# Patient Record
Sex: Male | Born: 1975 | Race: White | Hispanic: No | Marital: Married | Smoking: Former smoker
Health system: Southern US, Community
[De-identification: ages and names within clinical notes are randomized; demographics above are authoritative.]

## PROBLEM LIST (undated history)

## (undated) DIAGNOSIS — F32A Depression, unspecified: Secondary | ICD-10-CM

## (undated) HISTORY — DX: Depression, unspecified: F32.A

---
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
## 2004-01-13 ENCOUNTER — Other Ambulatory Visit (HOSPITAL_COMMUNITY): Admission: RE | Admit: 2004-01-13 | Discharge: 2004-01-19 | Payer: Self-pay | Admitting: *Deleted

## 2004-09-22 ENCOUNTER — Ambulatory Visit: Payer: Self-pay | Admitting: Family Medicine

## 2004-11-30 ENCOUNTER — Ambulatory Visit: Payer: Self-pay | Admitting: Family Medicine

## 2017-10-12 ENCOUNTER — Emergency Department (HOSPITAL_COMMUNITY)
Admission: EM | Admit: 2017-10-12 | Discharge: 2017-10-12 | Disposition: A | Discharge status: Home / Self Care | Payer: BLUE CROSS/BLUE SHIELD | Attending: Emergency Medicine | Admitting: Emergency Medicine

## 2017-10-12 ENCOUNTER — Emergency Department (HOSPITAL_COMMUNITY): Payer: BLUE CROSS/BLUE SHIELD

## 2017-10-12 ENCOUNTER — Encounter (HOSPITAL_COMMUNITY): Payer: Self-pay

## 2017-10-12 ENCOUNTER — Other Ambulatory Visit: Payer: Self-pay

## 2017-10-12 DIAGNOSIS — R079 Chest pain, unspecified: Secondary | ICD-10-CM | POA: Insufficient documentation

## 2017-10-12 DIAGNOSIS — R002 Palpitations: Secondary | ICD-10-CM | POA: Insufficient documentation

## 2017-10-12 LAB — CBC
HCT: 43 % (ref 39.0–52.0)
HEMOGLOBIN: 14.5 g/dL (ref 13.0–17.0)
MCH: 31.3 pg (ref 26.0–34.0)
MCHC: 33.7 g/dL (ref 30.0–36.0)
MCV: 92.7 fL (ref 78.0–100.0)
Platelets: 166 10*3/uL (ref 150–400)
RBC: 4.64 MIL/uL (ref 4.22–5.81)
RDW: 13 % (ref 11.5–15.5)
WBC: 4.5 10*3/uL (ref 4.0–10.5)

## 2017-10-12 LAB — BASIC METABOLIC PANEL
ANION GAP: 8 (ref 5–15)
BUN: 22 mg/dL — ABNORMAL HIGH (ref 6–20)
CALCIUM: 9.1 mg/dL (ref 8.9–10.3)
CHLORIDE: 106 mmol/L (ref 101–111)
CO2: 26 mmol/L (ref 22–32)
CREATININE: 0.78 mg/dL (ref 0.61–1.24)
GFR calc Af Amer: >60 mL/min (ref >60)
GFR calc non Af Amer: >60 mL/min (ref >60)
GLUCOSE: 77 mg/dL (ref 65–99)
Potassium: 3.2 mmol/L — ABNORMAL LOW (ref 3.5–5.1)
Sodium: 140 mmol/L (ref 135–145)

## 2017-10-12 LAB — I-STAT TROPONIN, ED: TROPONIN I, POC: 0 ng/mL (ref 0.00–0.08)

## 2017-10-12 NOTE — ED Provider Notes (Signed)
[facility] EMERGENCY DEPARTMENT Provider Note   CSN: [id number] Arrival date & time: 10/12/17  1320     History   Chief Complaint Chief Complaint  Patient presents with  . Chest Pain    HPI Ryan Wiggins is a 42 y.o. male.  HPI   42 year old male presents today with complaints of chest pain.  Patient notes that he was having some sinus related pressure and pain on the left side, this is slightly chronic in nature.  He has been on antibiotics several times for similar presentations.  He notes that his children were sick and went to urgent care today.  He notes that the nurse practitioner was asking thorough evaluation including chest pain.  He noted that he has had off-and-on left-sided chest discomfort and palpitations.  He notes they did an EKG and were concerned of the findings.  He was referred to the emergency room for further evaluation.  Patient reports that this is a very minor discomfort and soreness in the left lateral chest, he notes intermittently has palpitations, none presently, non-sustaining.  He denies any associated features.  He denies any cardiac history, he is a former smoker quitting approximately 15 years ago, no history of diabetes, hypertension or hyperlipidemia.  He does note family history of his grandfather having a heart attack in the late 93s otherwise no other family history.  He denies any history of DVT or PEs.     History reviewed. No pertinent past medical history.  There are no active problems to display for this patient.   History reviewed. No pertinent surgical history.     Home Medications    Prior to Admission medications   Not on File    Family History No family history on file.  Social History Social History   Tobacco Use  . Smoking status: Former Smoker  Substance Use Topics  . Alcohol use: Not on file  . Drug use: Not on file     Allergies   Patient has no known allergies.   Review of  Systems Review of Systems  All other systems reviewed and are negative.    Physical Exam Updated Vital Signs BP 118/88   Pulse (!) 55   Temp 98.1 F (36.7 C) (Oral)   Resp 19   SpO2 97%   Physical Exam  Constitutional: He is oriented to person, place, and time. He appears well-developed and well-nourished.  HENT:  Head: Normocephalic and atraumatic.  Eyes: Pupils are equal, round, and reactive to light. Conjunctivae are normal. Right eye exhibits no discharge. Left eye exhibits no discharge. No scleral icterus.  Neck: Normal range of motion. No JVD present. No tracheal deviation present.  Cardiovascular: Normal rate, regular rhythm, normal heart sounds and intact distal pulses. Exam reveals no gallop and no friction rub.  No murmur heard. Pulmonary/Chest: Effort normal and breath sounds normal. No stridor. No respiratory distress. He has no wheezes. He has no rales. He exhibits no tenderness.  Musculoskeletal: He exhibits no edema.  Neurological: He is alert and oriented to person, place, and time. Coordination normal.  Psychiatric: He has a normal mood and affect. His behavior is normal. Judgment and thought content normal.  Nursing note and vitals reviewed.    ED Treatments / Results  Labs (all labs ordered are listed, but only abnormal results are displayed) Labs Reviewed  BASIC METABOLIC PANEL - Abnormal; Notable for the following components:      Result Value   Potassium 3.2 (*)  BUN 22 (*)    All other components within normal limits  CBC  I-STAT TROPONIN, ED    EKG  EKG Interpretation None       ED EKG reviewed, no significant abnormalities, none previous to compare sinus bradycardia no ST depression or elevation  Radiology Dg Chest 2 View  Result Date: 10/12/2017 CLINICAL DATA:  Chest pain and sore throat.  Former smoker. EXAM: CHEST - 2 VIEW COMPARISON:  PA and lateral chest x-ray of May 13, 2005 FINDINGS: The lungs are well-expanded. There is an  azygos lobe anatomy on the right. The interstitial markings are mildly prominent. There is subtle nodularity overlying the lateral aspect of the left eighth rib. There was a smaller less dense nodular density on the 2006 study. The heart and pulmonary vascularity are normal. The mediastinum is normal in width. The trachea is midline. The bony thorax exhibits no acute abnormality. IMPRESSION: Mild chronic bronchitic-smoking related changes.  No pneumonia. Subtle nodularity peripherally in the left lower lung not clearly evident on the lateral view. Outpatient noncontrast chest CT scanning is recommended to exclude an abnormal nodule given the patient's smoking history. Electronically Signed   By: David  SwazilandJordan M.D.   On: 10/12/2017 14:14    Procedures Procedures (including critical care time)  Medications Ordered in ED Medications - No data to display   Initial Impression / Assessment and Plan / ED Course  I have reviewed the triage vital signs and the nursing notes.  Pertinent labs & imaging results that were available during my care of the patient were reviewed by me and considered in my medical decision making (see chart for details).     Final Clinical Impressions(s) / ED Diagnoses   Final diagnoses:  Nonspecific chest pain  Palpitations   Labs: I-STAT troponin, BMP, CBC  Imaging: DG chest 2 view, ED EKG  Consults:  Therapeutics:  Discharge Meds:   Assessment/Plan: 42 year old male presents today with chest pain.  Patient has very reassuring workup.  Normal troponin, reassuring EKG.  Patient's labs show hypokalemia.  I reviewed patient's EKG from urgent care, no significant findings requiring further evaluation or management here.  I do not believe patient is having any ACS symptoms he is low heart score, no signs of PE or DVT, no signs of dissection.  She does have incidental finding of nodule on chest x-ray, patient will follow-up as an outpatient for CT scan.  Patient is given  strict return precautions, verbalized understanding and agreement to today's plan had no further questions or concerns at the time of discharge.  Patient will follow-up with cardiology, ENT and primary care.       ED Discharge Orders    None       Rosalio LoudHedges, Cyan Moultrie, PA-C 10/12/17 1930    Phineas RealMabe, Latanya MaudlinMartha L, MD 10/12/17 [phone]

## 2017-10-12 NOTE — ED Triage Notes (Addendum)
Per pt: Pt went to urgent care and they said that he needed to come to the ED and have more tests completed.  Pt states that he just has "some soreness" and points to the area just right of his left arm pit. Pt denies any other symptoms. Pt states that he originally went to urgent care "because of a sinus thing and I have this knot in the back of my throat. The lady just kept asking me more and more questions and then they ended up doing an EKG".   EKG from urgent care is brought in with pt. Rate on EKG is 45. Someone wrote "junctional rhythm" on it.

## 2017-10-12 NOTE — ED Notes (Signed)
Jeff PA at bedside.  

## 2017-10-12 NOTE — Discharge Instructions (Addendum)
Please read attached information. If you experience any new or worsening signs or symptoms please return to the emergency room for evaluation. Please follow-up with your primary care provider or specialist as discussed.  °

## 2017-10-16 DIAGNOSIS — E041 Nontoxic single thyroid nodule: Secondary | ICD-10-CM | POA: Insufficient documentation

## 2017-10-16 DIAGNOSIS — Z8669 Personal history of other diseases of the nervous system and sense organs: Secondary | ICD-10-CM | POA: Insufficient documentation

## 2017-10-16 DIAGNOSIS — F319 Bipolar disorder, unspecified: Secondary | ICD-10-CM | POA: Insufficient documentation

## 2017-10-16 DIAGNOSIS — F410 Panic disorder [episodic paroxysmal anxiety] without agoraphobia: Secondary | ICD-10-CM | POA: Insufficient documentation

## 2017-10-16 DIAGNOSIS — Z8659 Personal history of other mental and behavioral disorders: Secondary | ICD-10-CM | POA: Insufficient documentation

## 2017-10-16 DIAGNOSIS — G2581 Restless legs syndrome: Secondary | ICD-10-CM | POA: Insufficient documentation

## 2017-10-16 DIAGNOSIS — J4530 Mild persistent asthma, uncomplicated: Secondary | ICD-10-CM | POA: Insufficient documentation

## 2017-10-16 DIAGNOSIS — R6882 Decreased libido: Secondary | ICD-10-CM | POA: Insufficient documentation

## 2017-10-16 DIAGNOSIS — F331 Major depressive disorder, recurrent, moderate: Secondary | ICD-10-CM | POA: Insufficient documentation

## 2018-08-26 IMAGING — DX DG CHEST 2V
2 series · 2 of 2 positions shown · non-contrast
Comparison: PA and lateral chest x-ray May 13, 2005

CLINICAL DATA: Chest pain and sore throat.  Former smoker.

EXAM:
CHEST - 2 VIEW

[chest pa]
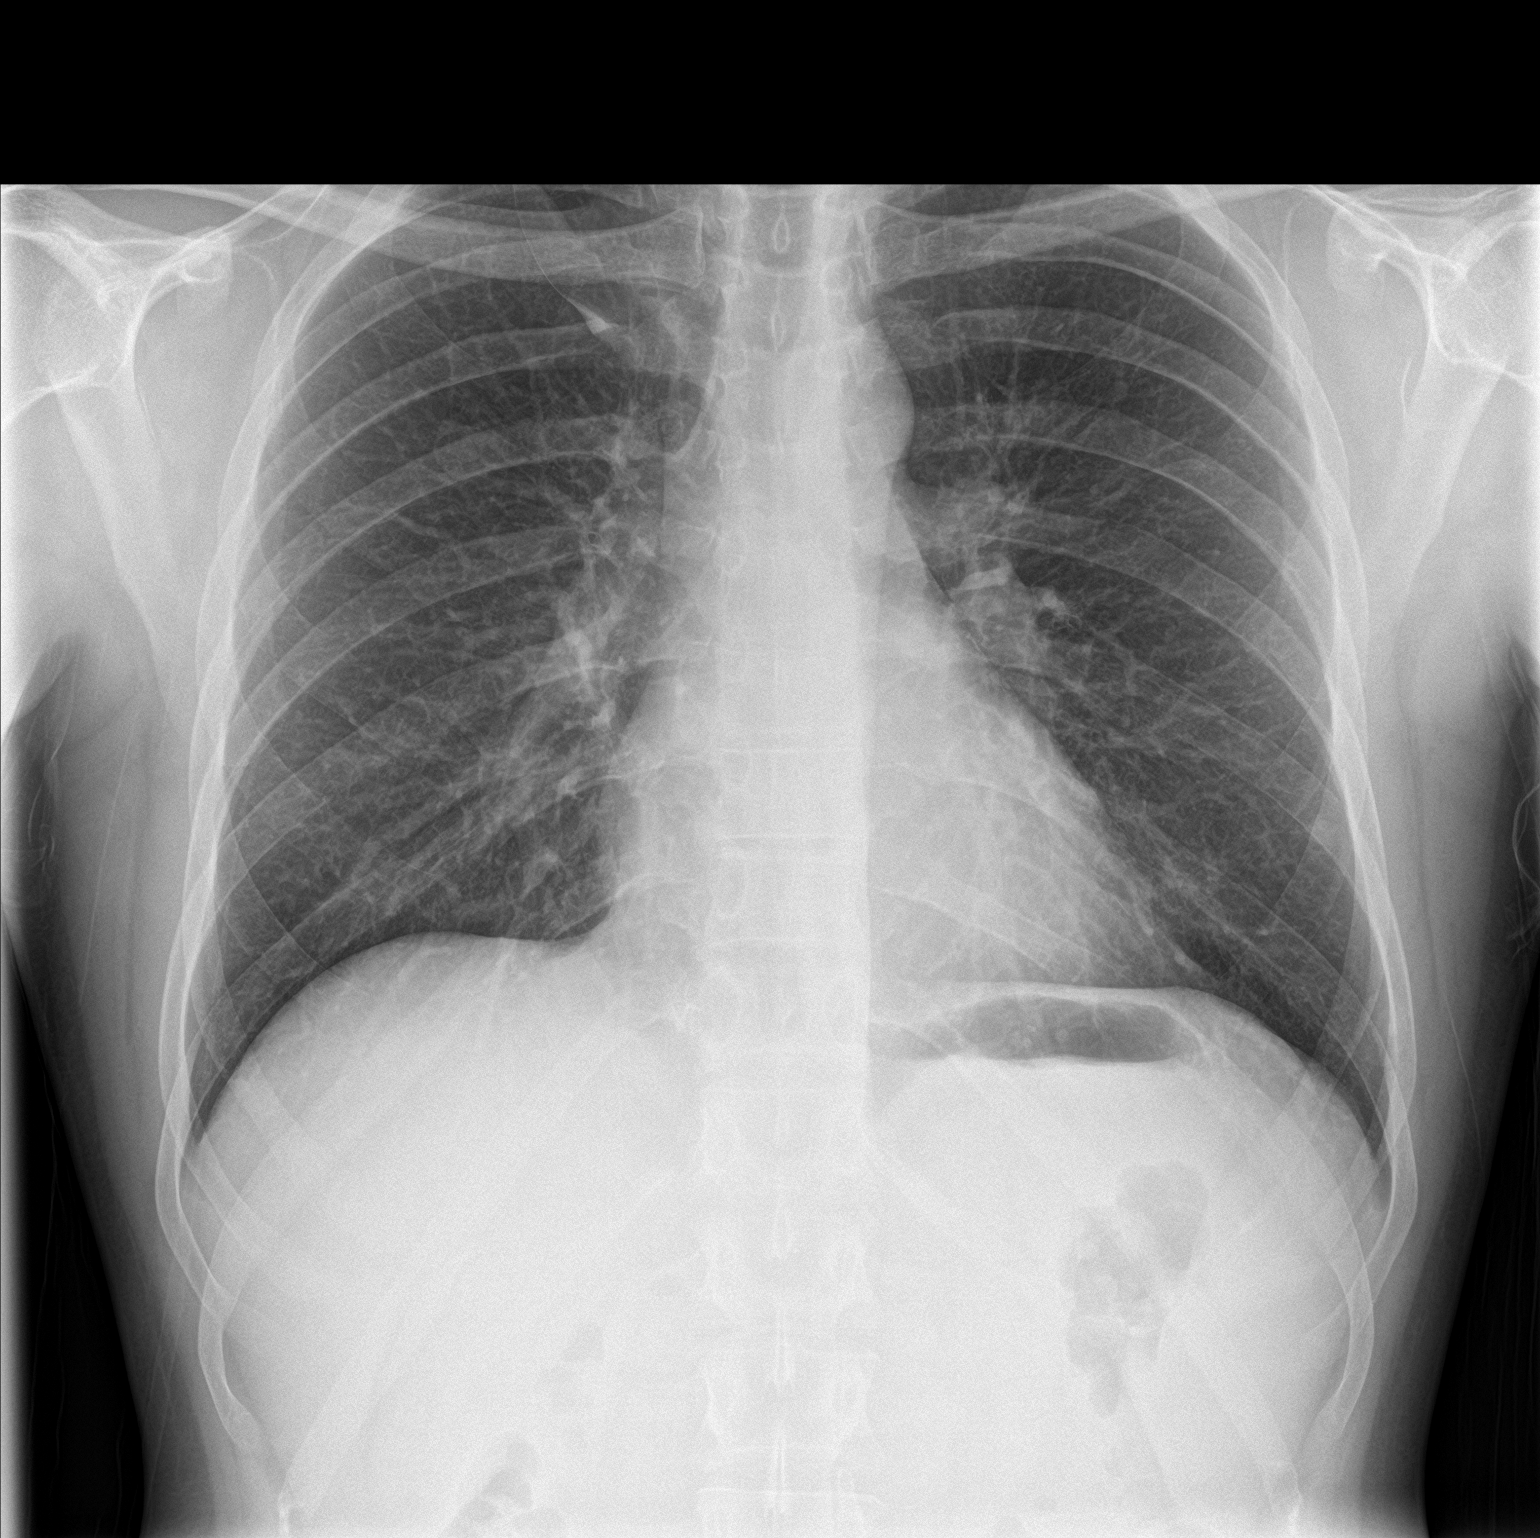

[chest lat]
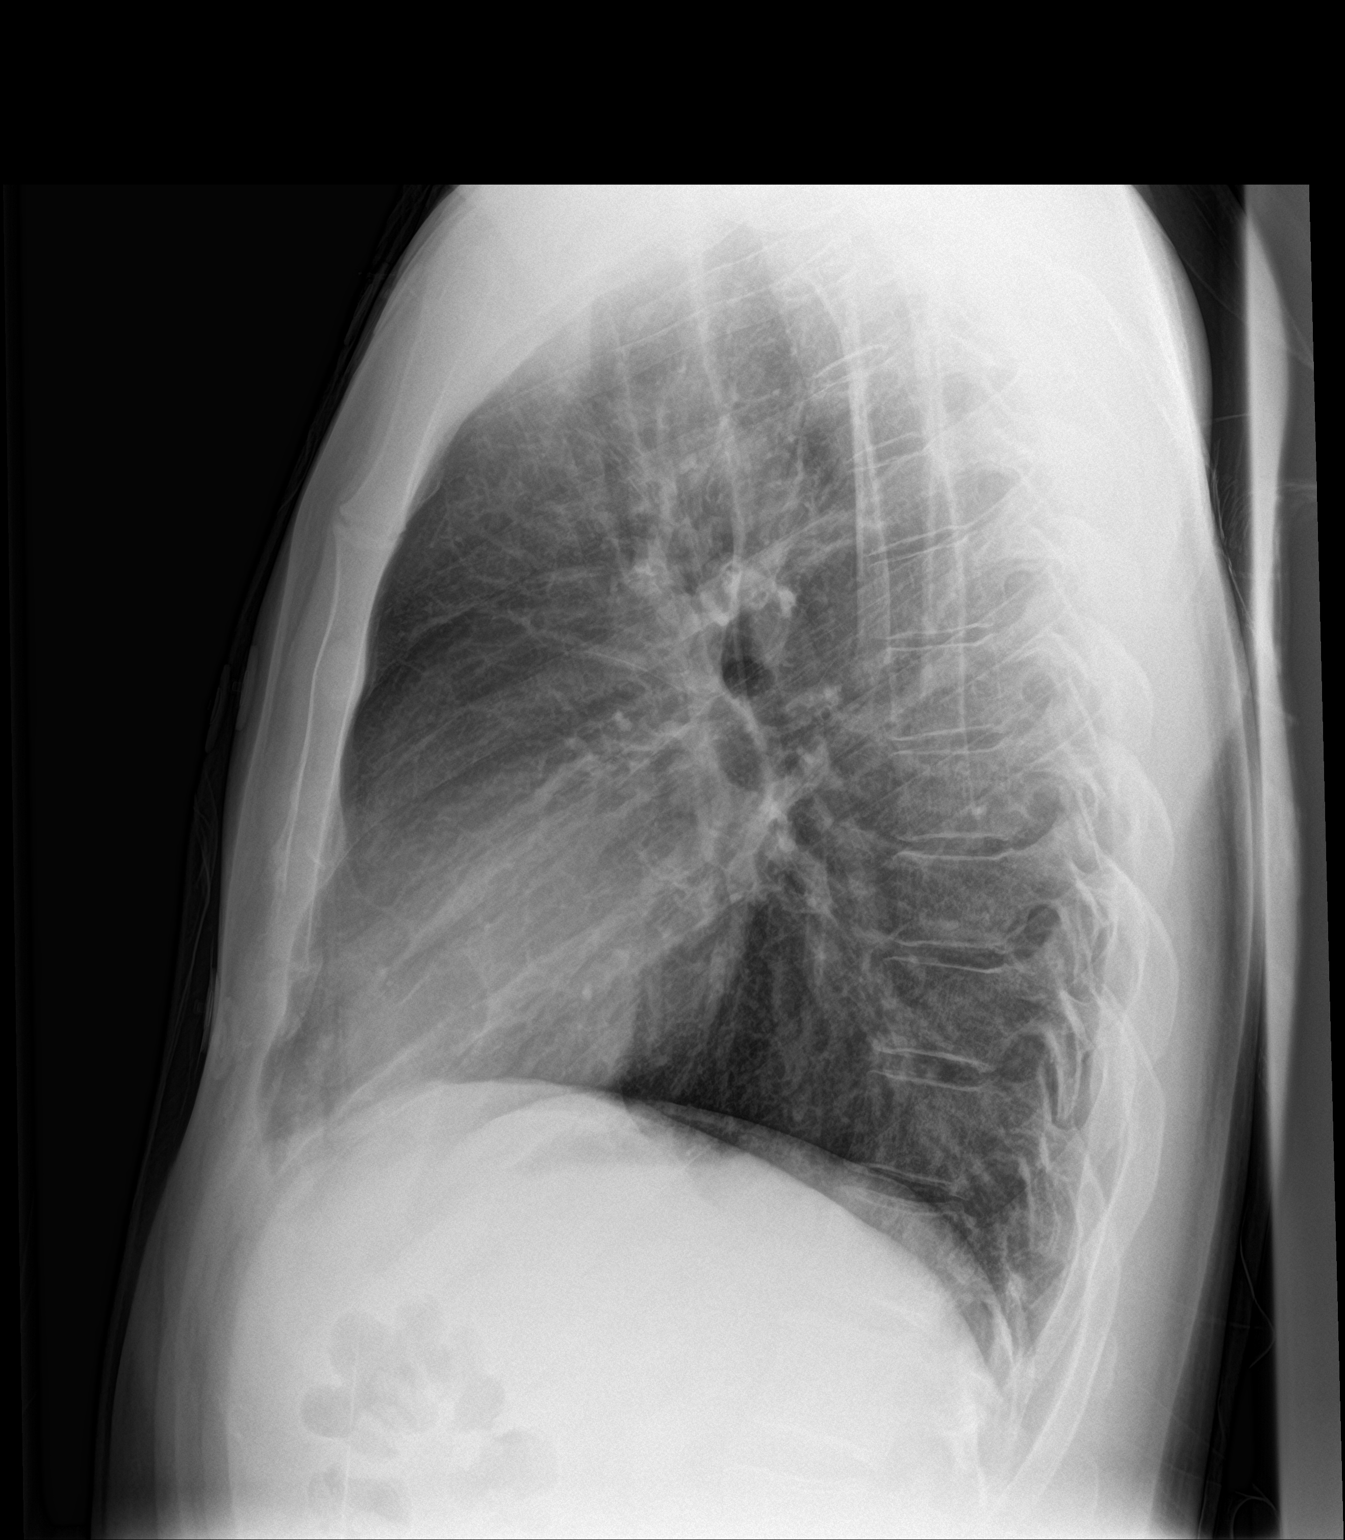

[2 of 2 positions shown; findings below may reference images not displayed]

FINDINGS: The lungs are well-expanded. There is an azygos lobe anatomy on the
right. The interstitial markings are mildly prominent. There is
subtle nodularity overlying the lateral aspect of the left eighth
rib. There was a smaller less dense nodular density on the 2334
study. The heart and pulmonary vascularity are normal. The
mediastinum is normal in width. The trachea is midline. The bony
thorax exhibits no acute abnormality.
IMPRESSION: Mild chronic bronchitic-smoking related changes.  No pneumonia.

Subtle nodularity peripherally in the left lower lung not clearly
evident on the lateral view. Outpatient noncontrast chest CT
scanning is recommended to exclude an abnormal nodule given the
patient's smoking history.

## 2023-08-29 DIAGNOSIS — K2100 Gastro-esophageal reflux disease with esophagitis, without bleeding: Secondary | ICD-10-CM | POA: Insufficient documentation

## 2024-01-24 ENCOUNTER — Encounter: Payer: Self-pay | Admitting: Family Medicine

## 2024-01-24 ENCOUNTER — Ambulatory Visit: Admitting: Family Medicine

## 2024-01-24 VITALS — BP 110/80 | HR 64 | Temp 97.6°F | Resp 16 | Ht 72.0 in | Wt 181.6 lb

## 2024-01-24 DIAGNOSIS — M75101 Unspecified rotator cuff tear or rupture of right shoulder, not specified as traumatic: Secondary | ICD-10-CM | POA: Diagnosis not present

## 2024-01-24 DIAGNOSIS — Z114 Encounter for screening for human immunodeficiency virus [HIV]: Secondary | ICD-10-CM | POA: Insufficient documentation

## 2024-01-24 DIAGNOSIS — Z1211 Encounter for screening for malignant neoplasm of colon: Secondary | ICD-10-CM | POA: Insufficient documentation

## 2024-01-24 DIAGNOSIS — R5382 Chronic fatigue, unspecified: Secondary | ICD-10-CM | POA: Insufficient documentation

## 2024-01-24 DIAGNOSIS — J4530 Mild persistent asthma, uncomplicated: Secondary | ICD-10-CM

## 2024-01-24 DIAGNOSIS — M25512 Pain in left shoulder: Secondary | ICD-10-CM | POA: Insufficient documentation

## 2024-01-24 DIAGNOSIS — E782 Mixed hyperlipidemia: Secondary | ICD-10-CM | POA: Diagnosis not present

## 2024-01-24 DIAGNOSIS — S46011A Strain of muscle(s) and tendon(s) of the rotator cuff of right shoulder, initial encounter: Secondary | ICD-10-CM | POA: Insufficient documentation

## 2024-01-24 DIAGNOSIS — Z1159 Encounter for screening for other viral diseases: Secondary | ICD-10-CM | POA: Insufficient documentation

## 2024-01-24 NOTE — Assessment & Plan Note (Signed)
 New onset pain likely due to overuse and degeneration, most likely a result of compensating - similar symptoms to the right shoulder have emerged - Take OTC NSAID's as needed, ice and rest

## 2024-01-24 NOTE — Assessment & Plan Note (Signed)
 Chronic tear with recent pain exacerbation and functional limitation. Previous MRI showed non-surgical tear. Temporary relief from steroid injections. Significant pain suggests possible progression. Prefers orthopedic consultation. - Continue to use sling, ice and use Aleve as needed for pain. - Order MRI of right shoulder at [facility]. - Refer to orthopedic surgery for evaluation and management.

## 2024-01-24 NOTE — Progress Notes (Signed)
 New Patient Office Visit  Subjective    Patient ID: Ryan Wiggins, male    DOB: 1976/04/06  Age: 48 y.o. MRN: [id number]  CC:  Chief Complaint  Patient presents with   Establish Care   Shoulder Pain    Discussed the use of AI scribe software for clinical note transcription with the patient, who gave verbal consent to proceed.  History of Present Illness   Ryan Wiggins is a 48 year old male who is here to establish care. He has a history of rotator cuff tear,  hyperlipidemia and asthma. His right shoulder pain has worsens recently and his left arm is starting to hurt as well.   Right shoulder pain - Worsening pain over the past month - Known rotator cuff tear diagnosed approximately 2 to 2.5 years ago - Pain located in right (dominant) shoulder, which is also the side he sleeps on - Acute exacerbation after moving an empty suitcase while on vacation, resulting in severe pain and temporary inability to move the arm - Received steroid injections at urgent care, which provided only temporary relief - He was given a sling at urgent care and told to ice it. - Takes Aleve as needed for pain. - Pain is constant and significantly impairs daily activities, including showering and brushing teeth - Pain is aggravated by certain movements, especially arm extension outward - Previous MRI and x-ray of right shoulder performed approximately 2 years ago - No recent imaging  Left shoulder symptoms - Emerging symptoms similar to right shoulder, with increasing discomfort  Hyperlipidemia - Not currently taking any medication. LDL and triglycerides elevated in November 2024. Patient admits that he needs to work on his diet and exercise and does not want to be on any medication.   Fatigue  - Experiences sporadic fatigue and low energy - Periods of high activity followed by extended fatigue  Asthma - Uses albuterol inhaler as needed - Denies chest pain or shortness of breath       Outpatient  Encounter Medications as of 01/24/2024  Medication Sig   albuterol (VENTOLIN HFA) 108 (90 Base) MCG/ACT inhaler Inhale 1 puff into the lungs every 6 (six) hours as needed for shortness of breath or wheezing.   No facility-administered encounter medications on file as of 01/24/2024.    Past Medical History:  Diagnosis Date   Depression     Past Surgical History:  Procedure Laterality Date   FINGER FRACTURE SURGERY  1989    Family History  Problem Relation Age of Onset   Cancer Mother    Ulcers Father    Arthritis Father    Diabetes Father    Healthy Sister    Healthy Brother    Healthy Son    Healthy Son    Healthy Maternal Grandmother    Healthy Maternal Grandfather    Heart disease Paternal Grandfather     Social History   Socioeconomic History   Marital status: Married    Spouse name: Not on file   Number of children: Not on file   Years of education: Not on file   Highest education level: Not on file  Occupational History   Not on file  Tobacco Use   Smoking status: Former    Current packs/day: 0.00    Types: Cigarettes    Quit date: 2017    Years since quitting: 8.5   Smokeless tobacco: Not on file  Vaping Use   Vaping status: Never Used  Substance and  Sexual Activity   Alcohol use: Not on file    Comment: socially/ very seldom   Drug use: Never   Sexual activity: Yes  Other Topics Concern   Not on file  Social History Narrative   Not on file   Social Drivers of Health   Financial Resource Strain: Not on file  Food Insecurity: Not on file  Transportation Needs: Not on file  Physical Activity: Not on file  Stress: Not on file  Social Connections: Not on file  Intimate Partner Violence: Not on file    Review of Systems  Constitutional:  Negative for chills, fever and malaise/fatigue.  HENT:  Negative for congestion, ear pain, sinus pain and sore throat.   Eyes: Negative.   Respiratory:  Negative for cough, shortness of breath and wheezing.    Cardiovascular:  Negative for chest pain, palpitations and leg swelling.  Gastrointestinal:  Negative for constipation, diarrhea, nausea and vomiting.  Genitourinary:  Negative for dysuria, frequency and urgency.  Musculoskeletal:  Positive for joint pain (right shoulder pain).  Skin: Negative.   Neurological:  Negative for dizziness and headaches.  Endo/Heme/Allergies: Negative.   Psychiatric/Behavioral: Negative.          Objective    BP 110/80   Pulse 64   Temp 97.6 F (36.4 C) (Temporal)   Resp 16   Ht 6' (1.829 m)   Wt 181 lb 9.6 oz (82.4 kg)   SpO2 97%   BMI 24.63 kg/m   Physical Exam Vitals reviewed.  Constitutional:      General: He is not in acute distress.    Appearance: Normal appearance. He is not ill-appearing.  Eyes:     Conjunctiva/sclera: Conjunctivae normal.  Cardiovascular:     Rate and Rhythm: Normal rate and regular rhythm.     Heart sounds: Normal heart sounds. No murmur heard. Pulmonary:     Effort: Pulmonary effort is normal.     Breath sounds: Normal breath sounds. No wheezing.  Musculoskeletal:     Right shoulder: Tenderness present. Decreased range of motion. Decreased strength.     Left shoulder: Normal.     Cervical back: Normal range of motion.  Skin:    General: Skin is warm.  Neurological:     Mental Status: He is alert. Mental status is at baseline.  Psychiatric:        Mood and Affect: Mood normal.        Behavior: Behavior normal.        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 Discussed omega-3 fish oil and red rice yeast as alternatives to statins. - Recommend omega-3 fish oil supplements and red rice yeast. - Advise dietary modifications to reduce fried foods and increase exercise.      Relevant Orders   COMPLETE METABOLIC PANEL WITH eGFR   Lipid Panel   CBC with  Differential   Chronic fatigue   Symptoms suggestive of low testosterone. Previous levels checked, seeking new provider for management. - Order testosterone level test.      Relevant Orders   Testosterone   Screening for colon cancer   Last colonoscopy 10-15 years ago - Order Cologuard test for colorectal cancer screening.      Relevant Orders   Cologuard   Encounter for hepatitis C screening test for low risk patient   Relevant Orders   Hepatitis C antibody   Encounter for screening for HIV   Relevant Orders   HIV Antibody (routine testing w rflx)   Acute pain of left shoulder   New onset pain likely due to overuse and degeneration, most likely a result of compensating - similar symptoms to the right shoulder have emerged - Take OTC NSAID's as needed, ice and rest         Follow-up: Return in about 4 months (around 05/26/2024) for Annual Physical.   Harrie Cedar, FNP [facility] [phone]

## 2024-01-24 NOTE — Assessment & Plan Note (Signed)
 Controlled with current medication management - albuterol inhaler - 1 puff Q 6 hours as needed

## 2024-01-24 NOTE — Assessment & Plan Note (Signed)
 Last colonoscopy 10-15 years ago - Order Cologuard test for colorectal cancer screening.

## 2024-01-24 NOTE — Assessment & Plan Note (Signed)
 Elevated cholesterol with preference for lifestyle modifications over medication. Discussed omega-3 fish oil and red rice yeast as alternatives to statins. - Recommend omega-3 fish oil supplements and red rice yeast. - Advise dietary modifications to reduce fried foods and increase exercise.

## 2024-01-24 NOTE — Assessment & Plan Note (Signed)
 Symptoms suggestive of low testosterone. Previous levels checked, seeking new provider for management. - Order testosterone level test.

## 2024-01-24 NOTE — Patient Instructions (Signed)
  VISIT SUMMARY: Today, you were seen for worsening right shoulder pain, new left shoulder pain, fatigue, and low energy. We also discussed your elevated cholesterol and potential testosterone deficiency. Additionally, we reviewed your general health maintenance needs, including routine screenings and vaccinations.  YOUR PLAN: -RIGHT SHOULDER ROTATOR CUFF TEAR: You have a chronic tear in your right shoulder's rotator cuff, which has recently worsened and is causing significant pain and difficulty with daily activities. We will order an MRI to assess the current state of your shoulder and refer you to an orthopedic surgeon for further evaluation and management.  -LEFT SHOULDER PAIN: The pain in your left shoulder is likely due to overuse and degeneration. We will monitor this and address it as needed.  -HYPERLIPIDEMIA: You have elevated cholesterol levels. We discussed lifestyle changes, including dietary modifications to reduce fried foods and increase exercise. We also recommend taking omega-3 fish oil supplements and red rice yeast as alternatives to statin medications.  -TESTOSTERONE DEFICIENCY: Your symptoms suggest low testosterone levels. We will order a testosterone level test to confirm this and determine the next steps for management.  -GENERAL HEALTH MAINTENANCE: You are due for routine screenings and vaccinations. We will order a Cologuard test for colorectal cancer screening and advise you to update your Tdap vaccination if it is not current.  INSTRUCTIONS: You will receive calls to schedule your MRI and orthopedic referral. A follow-up call to discuss your lab results and management plans will be made.                      Contains text generated by [employer].

## 2024-01-25 LAB — LIPID PANEL
Chol/HDL Ratio: 5.5 ratio — ABNORMAL HIGH (ref 0.0–5.0)
Cholesterol, Total: 186 mg/dL (ref 100–199)
HDL: 34 mg/dL — ABNORMAL LOW (ref >39)
LDL Chol Calc (NIH): 135 mg/dL — ABNORMAL HIGH (ref 0–99)
Triglycerides: 92 mg/dL (ref 0–149)
VLDL Cholesterol Cal: 17 mg/dL (ref 5–40)

## 2024-01-25 LAB — CBC WITH DIFFERENTIAL/PLATELET
Basophils Absolute: 0.1 10*3/uL (ref 0.0–0.2)
Basos: 1 %
EOS (ABSOLUTE): 0.2 10*3/uL (ref 0.0–0.4)
Eos: 3 %
Hematocrit: 48.5 % (ref 37.5–51.0)
Hemoglobin: 16 g/dL (ref 13.0–17.7)
Immature Grans (Abs): 0.1 10*3/uL (ref 0.0–0.1)
Immature Granulocytes: 1 %
Lymphocytes Absolute: 1.4 10*3/uL (ref 0.7–3.1)
Lymphs: 22 %
MCH: 30.7 pg (ref 26.6–33.0)
MCHC: 33 g/dL (ref 31.5–35.7)
MCV: 93 fL (ref 79–97)
Monocytes Absolute: 0.5 10*3/uL (ref 0.1–0.9)
Monocytes: 7 %
Neutrophils Absolute: 4.4 10*3/uL (ref 1.4–7.0)
Neutrophils: 66 %
Platelets: 206 10*3/uL (ref 150–450)
RBC: 5.21 x10E6/uL (ref 4.14–5.80)
RDW: 12 % (ref 11.6–15.4)
WBC: 6.7 10*3/uL (ref 3.4–10.8)

## 2024-01-25 LAB — HEPATITIS C ANTIBODY: Hep C Virus Ab: NONREACTIVE

## 2024-01-25 LAB — TESTOSTERONE: Testosterone: 438 ng/dL (ref 264–916)

## 2024-01-25 LAB — HIV ANTIBODY (ROUTINE TESTING W REFLEX): HIV Screen 4th Generation wRfx: NONREACTIVE

## 2024-01-29 ENCOUNTER — Ambulatory Visit: Payer: Self-pay | Admitting: Family Medicine

## 2024-02-08 ENCOUNTER — Ambulatory Visit (INDEPENDENT_AMBULATORY_CARE_PROVIDER_SITE_OTHER)
Admission: RE | Admit: 2024-02-08 | Discharge: 2024-02-08 | Disposition: A | Discharge status: Home / Self Care | Source: Ambulatory Visit | Attending: Family Medicine | Admitting: Family Medicine

## 2024-02-08 DIAGNOSIS — M75101 Unspecified rotator cuff tear or rupture of right shoulder, not specified as traumatic: Secondary | ICD-10-CM

## 2024-02-12 LAB — COLOGUARD: COLOGUARD: NEGATIVE

## 2024-02-16 ENCOUNTER — Other Ambulatory Visit: Payer: Self-pay | Admitting: Family Medicine

## 2024-05-27 ENCOUNTER — Encounter: Payer: Self-pay | Admitting: Family Medicine

## 2024-05-27 ENCOUNTER — Ambulatory Visit (INDEPENDENT_AMBULATORY_CARE_PROVIDER_SITE_OTHER): Admitting: Family Medicine

## 2024-05-27 VITALS — BP 110/80 | HR 60 | Temp 98.3°F | Ht 72.0 in | Wt 179.8 lb

## 2024-05-27 DIAGNOSIS — Z0001 Encounter for general adult medical examination with abnormal findings: Secondary | ICD-10-CM | POA: Diagnosis not present

## 2024-05-27 DIAGNOSIS — E782 Mixed hyperlipidemia: Secondary | ICD-10-CM | POA: Diagnosis not present

## 2024-05-27 DIAGNOSIS — R200 Anesthesia of skin: Secondary | ICD-10-CM | POA: Insufficient documentation

## 2024-05-27 DIAGNOSIS — J4530 Mild persistent asthma, uncomplicated: Secondary | ICD-10-CM

## 2024-05-27 DIAGNOSIS — Z125 Encounter for screening for malignant neoplasm of prostate: Secondary | ICD-10-CM | POA: Insufficient documentation

## 2024-05-27 DIAGNOSIS — Z1211 Encounter for screening for malignant neoplasm of colon: Secondary | ICD-10-CM | POA: Insufficient documentation

## 2024-05-27 DIAGNOSIS — F17210 Nicotine dependence, cigarettes, uncomplicated: Secondary | ICD-10-CM | POA: Diagnosis not present

## 2024-05-27 DIAGNOSIS — R0602 Shortness of breath: Secondary | ICD-10-CM | POA: Insufficient documentation

## 2024-05-27 DIAGNOSIS — K5900 Constipation, unspecified: Secondary | ICD-10-CM | POA: Insufficient documentation

## 2024-05-27 DIAGNOSIS — Z122 Encounter for screening for malignant neoplasm of respiratory organs: Secondary | ICD-10-CM | POA: Insufficient documentation

## 2024-05-27 DIAGNOSIS — R202 Paresthesia of skin: Secondary | ICD-10-CM

## 2024-05-27 DIAGNOSIS — Z716 Tobacco abuse counseling: Secondary | ICD-10-CM | POA: Insufficient documentation

## 2024-05-27 MED ORDER — VARENICLINE TARTRATE (STARTER) 0.5 MG X 11 & 1 MG X 42 PO TBPK: ORAL_TABLET | ORAL | 0 refills | Status: AC

## 2024-05-27 NOTE — Assessment & Plan Note (Signed)
 Cold hands and feet (possible Raynaud's syndrome) Chronic cold hands and feet, possibly due to Raynaud's syndrome. Potential causes include circulation issues and anxiety. - Check B12 levels to rule out deficiency. - Discuss medication options for cold hands and feet if B12 deficiency ruled out.

## 2024-05-27 NOTE — Assessment & Plan Note (Addendum)
 Shortness of breath with exertion. Differentials include asthma, anxiety or complications for 30 year smoking history.   - Order CT chest - lung cancer screening - Continue asthma medications as prescribed

## 2024-05-27 NOTE — Assessment & Plan Note (Signed)
 Adult Wellness Visit Routine adult wellness visit with general health concerns addressed. - Perform physical examination. - Order lab work including liver and kidney function tests. - Check cholesterol levels.  Things to do to keep yourself healthy  - Exercise at least 30-45 minutes a day, 3-4 days a week.  - Eat a low-fat diet with lots of fruits and vegetables, up to 7-9 servings per day.  - Seatbelts can save your life. Wear them always.  - Smoke detectors on every level of your home, check batteries every year.  - Eye Doctor - have an eye exam every 1-2 years  - Safe sex - if you may be exposed to STDs, use a condom.  - Alcohol -  If you drink, do it moderately, less than 2 drinks per day.  - Health Care Power of Attorney. Choose someone to speak for you if you are not able.  - Depression is common in our stressful world.If you're feeling down or losing interest in things you normally enjoy, please come in for a visit.  - Violence - If anyone is threatening or hurting you, please call immediately.

## 2024-05-27 NOTE — Progress Notes (Signed)
 Subjective:  Patient ID: Ryan Wiggins Free, male    DOB: 1976-06-14  Age: 48 y.o. MRN: [id number]  Chief Complaint  Patient presents with   Annual Exam   HPI:  Well Adult Physical: Patient here for a comprehensive physical exam.The patient reports problems - cold hands and feet, cancer screening, constipation, quit smoking and anxiety. Do you take any herbs or supplements that were not prescribed by a doctor? no Are you taking calcium supplements? no Are you taking aspirin daily? not applicable  Encounter for general adult medical examination without abnormal findings  Physical (At Risk items are starred): Patient's last physical exam was 1 year ago .  Patient wears a seat belt, has smoke detectors, has carbon monoxide detectors, practices appropriate gun safety, and wears sunscreen with extended sun exposure. Dental Care: biannual cleanings, brushes and flosses daily. Ophthalmology/Optometry: Annual visit.  Hearing loss: none Vision impairments: none Last PSA: NA  Discussed the use of AI scribe software for clinical note transcription with the patient, who gave verbal consent to proceed.  History of Present Illness   Ryan Wiggins is a 48 year old male who presents for an annual physical exam and to discuss concerns about cold extremities, smoking cessation, and anxiety.  Cold extremities and bradycardia - Cold hands and feet described as 'ice cold' and sweaty, which exacerbates the cold sensation - Symptoms have persisted for years - No numbness or tingling in fingers - Slow heartbeat - Concern for possible circulatory issues  Nicotine dependence and smoking cessation - Smoking habit ongoing for approximately 30 years - Multiple prior attempts to quit, but returned to smoking - Expresses desire to quit smoking  Dyspnea and asthma - Shortness of breath attributed to asthma - Symptoms worsen with anxiety and exertion - Uses a once-daily inhaler - Continues to experience  significant shortness of breath despite inhaler use - No chest pain or palpitations - During anxiety episodes, heart feels like it is 'going to pop out of my chest'  Anxiety - Anxiety exacerbates shortness of breath and sensation of heart pounding - Episodes of anxiety associated with feeling of heart racing  Constipation - Experiences constipation - Drinks a lot of water - Uncertain about fiber intake  Cancer and cardiovascular risk concerns - Family history of cancer: mother deceased from cancer, wife currently undergoing treatment for cancer for the third time - Concerned about personal cancer risk and inquires about cancer-related blood tests - Family history of cardiovascular disease: grandfather died of heart attack at age 33          05/27/2024    8:43 AM 01/24/2024   10:18 AM  Depression screen PHQ 2/9  Decreased Interest 3 0  Down, Depressed, Hopeless 0 0  PHQ - 2 Score 3 0  Altered sleeping 1 0  Tired, decreased energy 3 1  Change in appetite 0 0  Feeling bad or failure about yourself  0 0  Trouble concentrating 1 1  Moving slowly or fidgety/restless 0 0  Suicidal thoughts 0 0  PHQ-9 Score 8 2  Difficult doing work/chores Somewhat difficult Not difficult at all         01/24/2024   10:18 AM 05/27/2024    8:43 AM  Fall Risk  Falls in the past year? 0 0  Was there an injury with Fall? 0 0  Fall Risk Category Calculator 0 0  Patient at Risk for Falls Due to No Fall Risks No Fall Risks  Fall risk Follow  up Falls evaluation completed Falls evaluation completed              Past Medical History:  Diagnosis Date   Depression    Past Surgical History:  Procedure Laterality Date   FINGER FRACTURE SURGERY  1989    Family History  Problem Relation Age of Onset   Cancer Mother    Ulcers Father    Arthritis Father    Diabetes Father    Healthy Sister    Healthy Brother    Healthy Son    Healthy Son    Healthy Maternal Grandmother    Healthy Maternal  Grandfather    Heart disease Paternal Grandfather    Social History   Socioeconomic History   Marital status: Married    Spouse name: Not on file   Number of children: Not on file   Years of education: Not on file   Highest education level: Not on file  Occupational History   Not on file  Tobacco Use   Smoking status: Former    Current packs/day: 0.00    Types: Cigarettes    Quit date: 2017    Years since quitting: 8.8   Smokeless tobacco: Not on file  Vaping Use   Vaping status: Never Used  Substance and Sexual Activity   Alcohol use: Not on file    Comment: socially/ very seldom   Drug use: Never   Sexual activity: Yes  Other Topics Concern   Not on file  Social History Narrative   Not on file   Social Drivers of Health   Financial Resource Strain: Not on file  Food Insecurity: Not on file  Transportation Needs: Not on file  Physical Activity: Not on file  Stress: Not on file  Social Connections: Not on file   Review of Systems  Constitutional:  Negative for chills, fatigue and fever.  HENT:  Negative for congestion, ear pain, sinus pressure and sore throat.   Respiratory:  Positive for shortness of breath. Negative for cough and wheezing.   Cardiovascular:  Negative for chest pain.  Gastrointestinal:  Positive for constipation. Negative for abdominal pain, diarrhea, nausea and vomiting.  Genitourinary:  Negative for dysuria and frequency.  Musculoskeletal:  Negative for arthralgias, back pain and myalgias.  Neurological:  Positive for numbness (hands and feet (cold)). Negative for dizziness and headaches.  Psychiatric/Behavioral:  Negative for dysphoric mood. The patient is not nervous/anxious.      Objective:  BP 110/80   Pulse 60   Temp 98.3 F (36.8 C)   Ht 6' (1.829 m)   Wt 179 lb 12.8 oz (81.6 kg)   SpO2 98%   BMI 24.39 kg/m      05/27/2024    8:41 AM 01/24/2024    9:38 AM 10/12/2017    6:00 PM  BP/Weight  Systolic BP 110 110 118  Diastolic  BP 80 80 88  Wt. (Lbs) 179.8 181.6   BMI 24.39 kg/m2 24.63 kg/m2     Physical Exam Vitals reviewed.  Constitutional:      General: He is not in acute distress.    Appearance: Normal appearance. He is well-groomed and normal weight. He is not ill-appearing.  HENT:     Head: Normocephalic.     Right Ear: Tympanic membrane, ear canal and external ear normal. There is no impacted cerumen.     Left Ear: Tympanic membrane, ear canal and external ear normal. There is no impacted cerumen.     Nose: Nose  normal. No congestion or rhinorrhea.     Mouth/Throat:     Mouth: Mucous membranes are moist.     Pharynx: Oropharynx is clear. No posterior oropharyngeal erythema.  Eyes:     Extraocular Movements: Extraocular movements intact.     Conjunctiva/sclera: Conjunctivae normal.     Pupils: Pupils are equal, round, and reactive to light.  Cardiovascular:     Rate and Rhythm: Normal rate and regular rhythm.     Pulses: Normal pulses.     Heart sounds: Normal heart sounds. No murmur heard. Pulmonary:     Effort: Pulmonary effort is normal. No respiratory distress.     Breath sounds: Normal breath sounds. No wheezing.  Abdominal:     General: Bowel sounds are normal.     Palpations: Abdomen is soft.     Tenderness: There is no abdominal tenderness.  Musculoskeletal:        General: Normal range of motion.     Cervical back: Normal range of motion and neck supple.  Lymphadenopathy:     Cervical: No cervical adenopathy.  Skin:    General: Skin is warm and dry.  Neurological:     Mental Status: He is alert and oriented to person, place, and time. Mental status is at baseline.     Cranial Nerves: Cranial nerves 2-12 are intact.     Sensory: Sensation is intact.     Motor: Motor function is intact.     Coordination: Coordination is intact.     Gait: Gait is intact.     Deep Tendon Reflexes: Reflexes are normal and symmetric.  Psychiatric:        Attention and Perception: Attention and  perception normal.        Mood and Affect: Mood is anxious.        Speech: Speech normal.        Behavior: Behavior normal. Behavior is cooperative.        Thought Content: Thought content normal.        Cognition and Memory: Cognition normal.        Judgment: Judgment normal.     Lab Results  Component Value Date   WBC 6.7 01/24/2024   HGB 16.0 01/24/2024   HCT 48.5 01/24/2024   PLT 206 01/24/2024   GLUCOSE 77 10/12/2017   CHOL 186 01/24/2024   TRIG 92 01/24/2024   HDL 34 (L) 01/24/2024   LDLCALC 135 (H) 01/24/2024   NA 140 10/12/2017   K 3.2 (L) 10/12/2017   CL 106 10/12/2017   CREATININE 0.78 10/12/2017   BUN 22 (H) 10/12/2017   CO2 26 10/12/2017      Assessment & Plan:  Encounter for general adult medical examination with abnormal findings Assessment & Plan: Adult Wellness Visit Routine adult wellness visit with general health concerns addressed. - Perform physical examination. - Order lab work including liver and kidney function tests. - Check cholesterol levels.  Things to do to keep yourself healthy  - Exercise at least 30-45 minutes a day, 3-4 days a week.  - Eat a low-fat diet with lots of fruits and vegetables, up to 7-9 servings per day.  - Seatbelts can save your life. Wear them always.  - Smoke detectors on every level of your home, check batteries every year.  - Eye Doctor - have an eye exam every 1-2 years  - Safe sex - if you may be exposed to STDs, use a condom.  - Alcohol -  If you drink,  do it moderately, less than 2 drinks per day.  - Health Care Power of Attorney. Choose someone to speak for you if you are not able.  - Depression is common in our stressful world.If you're feeling down or losing interest in things you normally enjoy, please come in for a visit.  - Violence - If anyone is threatening or hurting you, please call immediately.   Orders: -     Comprehensive metabolic panel with GFR -     T4 AND TSH  Mixed  hyperlipidemia Assessment & Plan: Mixed hyperlipidemia Elevated cholesterol levels with increased cardiovascular risk due to smoking and family history of heart disease. Last lipids Lab Results  Component Value Date   CHOL 186 01/24/2024   HDL 34 (L) 01/24/2024   LDLCALC 135 (H) 01/24/2024   TRIG 92 01/24/2024   CHOLHDL 5.5 (H) 01/24/2024    - Check cholesterol levels. - Consider calcium score CT if indicated by cholesterol results.  Orders: -     Lipid panel  Mild persistent asthma without complication Assessment & Plan: Controlled with current medication management - Albuterol inhaler - 1 puff Q 6 hours as needed - Advair - 1 puff daily   Cigarette nicotine dependence without complication Assessment & Plan: Nicotine dependence, cigarettes Long-term nicotine dependence with a 30-year smoking history. Expressed desire to quit smoking. Chose medication for smoking cessation with potential dual benefit for anxiety management. - Prescribe medication for smoking cessation. - Follow up in 2-3 weeks to assess response to smoking cessation medication.  Orders: -     Varenicline Tartrate (Starter); Take 0.5 mg by mouth daily for 3 days, THEN 0.5 mg 2 (two) times daily for 4 days, THEN 1 mg 2 (two) times daily for 21 days.  Dispense: 1 each; Refill: 0  Shortness of breath -     CT CHEST LUNG CANCER SCREENING LOW DOSE WO CONTRAST; Future  Numbness and tingling of both feet Assessment & Plan: Cold hands and feet (possible Raynaud's syndrome) Chronic cold hands and feet, possibly due to Raynaud's syndrome. Potential causes include circulation issues and anxiety. - Check B12 levels to rule out deficiency. - Discuss medication options for cold hands and feet if B12 deficiency ruled out.  Orders: -     B12 and Folate Panel  Constipation, unspecified constipation type Assessment & Plan: Constipation Recent onset of constipation, possibly related to anxiety or dietary factors.  Discussed role of fiber and hydration in management. - Recommend fiber supplements. - Encourage adequate hydration.    Screening for prostate cancer -     PSA  Encounter for screening for lung cancer Assessment & Plan: Screening for lung cancer.  Concern about cancer risk due to family history.  Discussed CT for lung cancer screening. - >30 year smoking history - Order CT of the chest to screen for lung cancer.  Orders: -     CT CHEST LUNG CANCER SCREENING LOW DOSE WO CONTRAST; Future  Encounter for smoking cessation counseling -     Varenicline Tartrate (Starter); Take 0.5 mg by mouth daily for 3 days, THEN 0.5 mg 2 (two) times daily for 4 days, THEN 1 mg 2 (two) times daily for 21 days.  Dispense: 1 each; Refill: 0  Screen for colon cancer -     Ambulatory referral to Gastroenterology    These are the goals we discussed:  Goals      Quit Smoking     - Prescribe Chantix - FU in 2 weeks  This is a list of the screening recommended for you and due dates:  Health Maintenance  Topic Date Due   DTaP/Tdap/Td vaccine (1 - Tdap) Never done   Pneumococcal Vaccine (1 of 2 - PCV) Never done   Hepatitis B Vaccine (1 of 3 - 19+ 3-dose series) Never done   Colon Cancer Screening  Never done   COVID-19 Vaccine (1 - 2025-26 season) Never done   Flu Shot  06/03/2024*   Hepatitis C Screening  Completed   HIV Screening  Completed   HPV Vaccine  Aged Out   Meningitis B Vaccine  Aged Out  *Topic was postponed. The date shown is not the original due date.     Meds ordered this encounter  Medications   Varenicline Tartrate, Starter, (CHANTIX STARTING MONTH PAK) 0.5 MG X 11 & 1 MG X 42 TBPK    Sig: Take 0.5 mg by mouth daily for 3 days, THEN 0.5 mg 2 (two) times daily for 4 days, THEN 1 mg 2 (two) times daily for 21 days.    Dispense:  1 each    Refill:  0     Follow-up: Return in about 2 weeks (around 06/10/2024) for med check.  An After Visit Summary was printed  and given to the patient.  Harrie Cedar, FNP [facility] [phone]

## 2024-05-27 NOTE — Patient Instructions (Signed)
  VISIT SUMMARY: Today, you had your annual physical exam and discussed several health concerns including cold extremities, smoking cessation, anxiety, shortness of breath, and constipation. We performed a physical examination and ordered lab work to check your liver and kidney function, as well as your cholesterol levels. We also discussed your concerns about cancer and cardiovascular risk.  YOUR PLAN: -COLD EXTREMITIES AND BRADYCARDIA: You have been experiencing cold hands and feet, which may be due to circulation issues or anxiety. We will look into medication options for this and check your B12 levels to rule out a deficiency.  -NICOTINE DEPENDENCE AND SMOKING CESSATION: You have a long history of smoking and want to quit. We have prescribed medication to help you stop smoking, which may also help with your anxiety. We will follow up in 2-3 weeks to see how you are doing.  -DYSPNEA AND ASTHMA: Your shortness of breath is related to your asthma and is worsened by anxiety and exertion. Continue using your current inhaler as prescribed.  -ANXIETY DISORDER: Your anxiety is contributing to your shortness of breath and possibly your constipation. We will monitor how you respond to the smoking cessation medication, which may also help with your anxiety. Additional anxiety management strategies may be considered if needed.  -MIXED HYPERLIPIDEMIA: You have elevated cholesterol levels, which increases your risk of heart disease, especially with your smoking history and family history of heart disease. We will check your cholesterol levels and may consider a calcium score CT based on the results.  -SCREENING FOR MALIGNANT NEOPLASM OF PROSTATE AND RESPIRATORY ORGANS: Given your family history of cancer and personal concerns, we will screen for prostate cancer with a PSA test and for lung cancer with a CT scan of your chest.  -CONSTIPATION: Your constipation may be related to anxiety or dietary factors. We  recommend taking fiber supplements and ensuring you stay well-hydrated.  INSTRUCTIONS: Please follow up in 2-3 weeks to assess your response to the smoking cessation medication. We will also review your lab results and any additional tests at that time.                      Contains text generated by [employer].                                 Contains text generated by [employer].

## 2024-05-27 NOTE — Assessment & Plan Note (Signed)
 Constipation Recent onset of constipation, possibly related to anxiety or dietary factors. Discussed role of fiber and hydration in management. - Recommend fiber supplements. - Encourage adequate hydration.

## 2024-05-27 NOTE — Assessment & Plan Note (Signed)
 Nicotine dependence, cigarettes Long-term nicotine dependence with a 30-year smoking history. Expressed desire to quit smoking. Chose medication for smoking cessation with potential dual benefit for anxiety management. - Prescribe medication for smoking cessation. - Follow up in 2-3 weeks to assess response to smoking cessation medication.

## 2024-05-27 NOTE — Assessment & Plan Note (Signed)
 Controlled with current medication management - Albuterol inhaler - 1 puff Q 6 hours as needed - Advair - 1 puff daily

## 2024-05-27 NOTE — Assessment & Plan Note (Signed)
 Mixed hyperlipidemia Elevated cholesterol levels with increased cardiovascular risk due to smoking and family history of heart disease. Last lipids Lab Results  Component Value Date   CHOL 186 01/24/2024   HDL 34 (L) 01/24/2024   LDLCALC 135 (H) 01/24/2024   TRIG 92 01/24/2024   CHOLHDL 5.5 (H) 01/24/2024    - Check cholesterol levels. - Consider calcium score CT if indicated by cholesterol results.

## 2024-05-27 NOTE — Assessment & Plan Note (Signed)
 Screening for lung cancer.  Concern about cancer risk due to family history.  Discussed CT for lung cancer screening. - >30 year smoking history - Order CT of the chest to screen for lung cancer.

## 2024-05-28 LAB — COMPREHENSIVE METABOLIC PANEL WITH GFR
ALT: 16 IU/L (ref 0–44)
AST: 14 IU/L (ref 0–40)
Albumin: 4.6 g/dL (ref 4.1–5.1)
Alkaline Phosphatase: 91 IU/L (ref 47–123)
BUN/Creatinine Ratio: 26 — ABNORMAL HIGH (ref 9–20)
BUN: 21 mg/dL (ref 6–24)
Bilirubin Total: 0.5 mg/dL (ref 0.0–1.2)
CO2: 23 mmol/L (ref 20–29)
Calcium: 9.6 mg/dL (ref 8.7–10.2)
Chloride: 100 mmol/L (ref 96–106)
Creatinine, Ser: 0.82 mg/dL (ref 0.76–1.27)
Globulin, Total: 2.2 g/dL (ref 1.5–4.5)
Glucose: 66 mg/dL — ABNORMAL LOW (ref 70–99)
Potassium: 4.6 mmol/L (ref 3.5–5.2)
Sodium: 139 mmol/L (ref 134–144)
Total Protein: 6.8 g/dL (ref 6.0–8.5)
eGFR: 108 mL/min/1.73 (ref >59)

## 2024-05-28 LAB — LIPID PANEL
Chol/HDL Ratio: 4.6 ratio (ref 0.0–5.0)
Cholesterol, Total: 157 mg/dL (ref 100–199)
HDL: 34 mg/dL — ABNORMAL LOW (ref >39)
LDL Chol Calc (NIH): 105 mg/dL — ABNORMAL HIGH (ref 0–99)
Triglycerides: 99 mg/dL (ref 0–149)
VLDL Cholesterol Cal: 18 mg/dL (ref 5–40)

## 2024-05-28 LAB — B12 AND FOLATE PANEL
Folate: 3.8 ng/mL (ref >3.0)
Vitamin B-12: 301 pg/mL (ref 232–1245)

## 2024-05-28 LAB — T4 AND TSH
T4, Total: 5.8 ug/dL (ref 4.5–12.0)
TSH: 3.16 u[IU]/mL (ref 0.450–4.500)

## 2024-05-28 LAB — PSA: Prostate Specific Ag, Serum: 0.8 ng/mL (ref 0.0–4.0)

## 2024-05-29 ENCOUNTER — Ambulatory Visit: Payer: Self-pay | Admitting: Family Medicine

## 2024-05-30 ENCOUNTER — Other Ambulatory Visit: Payer: Self-pay | Admitting: Family Medicine

## 2024-05-30 DIAGNOSIS — R0602 Shortness of breath: Secondary | ICD-10-CM

## 2024-06-10 ENCOUNTER — Ambulatory Visit: Admitting: Family Medicine
